# Patient Record
Sex: Female | Born: 1995
Health system: Southern US, Community
[De-identification: ages and names within clinical notes are randomized; demographics above are authoritative.]

## PROBLEM LIST (undated history)

## (undated) DIAGNOSIS — S42009A Fracture of unspecified part of unspecified clavicle, initial encounter for closed fracture: Secondary | ICD-10-CM

---
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
## 2017-04-18 ENCOUNTER — Emergency Department (HOSPITAL_BASED_OUTPATIENT_CLINIC_OR_DEPARTMENT_OTHER)
Admission: EM | Admit: 2017-04-18 | Discharge: 2017-04-18 | Disposition: A | Discharge status: Home / Self Care | Payer: 59 | Attending: Emergency Medicine | Admitting: Emergency Medicine

## 2017-04-18 ENCOUNTER — Encounter (HOSPITAL_BASED_OUTPATIENT_CLINIC_OR_DEPARTMENT_OTHER): Payer: Self-pay | Admitting: *Deleted

## 2017-04-18 DIAGNOSIS — K2900 Acute gastritis without bleeding: Secondary | ICD-10-CM | POA: Insufficient documentation

## 2017-04-18 DIAGNOSIS — R1110 Vomiting, unspecified: Secondary | ICD-10-CM | POA: Diagnosis present

## 2017-04-18 LAB — PREGNANCY, URINE: Preg Test, Ur: NEGATIVE

## 2017-04-18 LAB — CBC WITH DIFFERENTIAL/PLATELET
BASOS PCT: 0 %
Basophils Absolute: 0 10*3/uL (ref 0.0–0.1)
EOS ABS: 0.1 10*3/uL (ref 0.0–0.7)
EOS PCT: 2 %
HCT: 37.4 % (ref 36.0–46.0)
Hemoglobin: 12.9 g/dL (ref 12.0–15.0)
Lymphocytes Relative: 29 %
Lymphs Abs: 1.9 10*3/uL (ref 0.7–4.0)
MCH: 30.7 pg (ref 26.0–34.0)
MCHC: 34.5 g/dL (ref 30.0–36.0)
MCV: 89 fL (ref 78.0–100.0)
MONOS PCT: 9 %
Monocytes Absolute: 0.6 10*3/uL (ref 0.1–1.0)
Neutro Abs: 3.9 10*3/uL (ref 1.7–7.7)
Neutrophils Relative %: 60 %
Platelets: 205 10*3/uL (ref 150–400)
RBC: 4.2 MIL/uL (ref 3.87–5.11)
RDW: 12.1 % (ref 11.5–15.5)
WBC: 6.6 10*3/uL (ref 4.0–10.5)

## 2017-04-18 LAB — COMPREHENSIVE METABOLIC PANEL
ALBUMIN: 4.2 g/dL (ref 3.5–5.0)
ALT: 14 U/L (ref 14–54)
AST: 20 U/L (ref 15–41)
Alkaline Phosphatase: 52 U/L (ref 38–126)
Anion gap: 8 (ref 5–15)
BUN: 14 mg/dL (ref 6–20)
CO2: 24 mmol/L (ref 22–32)
Calcium: 9.2 mg/dL (ref 8.9–10.3)
Chloride: 108 mmol/L (ref 101–111)
Creatinine, Ser: 0.77 mg/dL (ref 0.44–1.00)
GFR calc Af Amer: >60 mL/min (ref >60)
GFR calc non Af Amer: >60 mL/min (ref >60)
GLUCOSE: 100 mg/dL — AB (ref 65–99)
POTASSIUM: 3.6 mmol/L (ref 3.5–5.1)
SODIUM: 140 mmol/L (ref 135–145)
Total Bilirubin: 0.3 mg/dL (ref 0.3–1.2)
Total Protein: 7 g/dL (ref 6.5–8.1)

## 2017-04-18 LAB — URINALYSIS, ROUTINE W REFLEX MICROSCOPIC
BILIRUBIN URINE: NEGATIVE
Glucose, UA: NEGATIVE mg/dL
Hgb urine dipstick: NEGATIVE
KETONES UR: NEGATIVE mg/dL
Leukocytes, UA: NEGATIVE
Nitrite: NEGATIVE
PROTEIN: NEGATIVE mg/dL
Specific Gravity, Urine: 1.007 (ref 1.005–1.030)
pH: 5.5 (ref 5.0–8.0)

## 2017-04-18 MED ORDER — ONDANSETRON HCL 4 MG/2ML IJ SOLN
4.0000 mg | Freq: Once | INTRAMUSCULAR | Status: AC
  Administered 2017-04-18: 4 mg via INTRAVENOUS
  Filled 2017-04-18: qty 2

## 2017-04-18 MED ORDER — SODIUM CHLORIDE 0.9 % IV BOLUS (SEPSIS)
1000.0000 mL | Freq: Once | INTRAVENOUS | Status: AC
  Administered 2017-04-18: 1000 mL via INTRAVENOUS

## 2017-04-18 MED ORDER — ONDANSETRON 4 MG PO TBDP: 4.0000 mg | ORAL_TABLET | Freq: Three times a day (TID) | ORAL | 1 refills | Status: AC | PRN

## 2017-04-18 MED FILL — ONDANSETRON ODT 4 MG TABLET: 4 | 4 days supply | Qty: 10 | Fill #0

## 2017-04-18 NOTE — Discharge Instructions (Signed)
Take Zofran as needed for nausea and vomiting. Return for development of rash fever or persistent diarrhea.

## 2017-04-18 NOTE — ED Provider Notes (Signed)
[facility]PT [facility] Provider Note   CSN: [id number] Arrival date & time: 04/18/17  0865     History   Chief Complaint Chief Complaint  Patient presents with  . Emesis    HPI Lisa Trevino is a 21 y.o. female.  Patient just recently returned from [place] She was down there on a school trip did include some mission work. Patient got in very late driving back from [place]. Patient awoke around 4 in the morning and vomited the one to 2 times. Had significant chills but did not feel like she had a fever. One loose bowel movement but no subsequent vomiting or diarrhea. No rash. No history of any tick bites. Patient was feeling fine until this illness.      Past Medical History:  Diagnosis Date  . Clavicle fracture    required splinting    Patient Active Problem List   Diagnosis Date Noted  . Acute appendicitis 11/21/2013  . 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 APPENDECTOMY  11/21/2013   lap appy  . LAPAROSCOPIC APPENDECTOMY N/A 11/21/2013   Procedure: APPENDECTOMY LAPAROSCOPIC;  Surgeon: Jerilynn Mages. Gerald Stabs, MD;  Location: [facility];  Service: Pediatrics;  Laterality: N/A;  . WISDOM TOOTH EXTRACTION  01/2012    OB History    Gravida Para Term Preterm AB Living   0             SAB TAB Ectopic Multiple Live Births                   Home Medications    Prior to Admission medications   Medication Sig Start Date End Date Taking? Authorizing Provider  ondansetron (ZOFRAN ODT) 4 MG disintegrating tablet Take 1 tablet (4 mg total) by mouth every 8 (eight) hours as needed. 04/18/17   Fredia Sorrow, MD    Family History Family History  Problem Relation Age of Onset  . Cancer Maternal Grandmother        bone marrow, deceased  . Heart attack Maternal Grandfather        deceased  . Thyroid disease Paternal Grandmother   . Heart attack Paternal Grandmother        deceased  . Cancer Paternal  Grandfather        prostate  . Cancer Father        prostate    Social History Social History  Substance Use Topics  . Smoking status: Never Smoker  . Smokeless tobacco: Never Used  . Alcohol use No     Allergies   Patient has no known allergies.   Review of Systems Review of Systems  Constitutional: Positive for chills. Negative for diaphoresis and fever.  HENT: Negative for congestion.   Eyes: Negative for redness.  Respiratory: Negative for shortness of breath.   Cardiovascular: Negative for chest pain.  Gastrointestinal: Positive for nausea and vomiting. Negative for abdominal pain.  Genitourinary: Negative for dysuria.  Musculoskeletal: Negative for myalgias.  Skin: Negative for rash.  Neurological: Negative for headaches.  Hematological: Does not bruise/bleed easily.  Psychiatric/Behavioral: Negative for confusion.     Physical Exam Updated Vital Signs BP 138/72   Pulse 72   Temp 97.6 F (36.4 C)   Resp 18   Ht 1.702 m (5\' 7" )   Wt 68 kg (150 lb)   LMP 03/30/2017   SpO2 99%   BMI 23.49 kg/m   Physical Exam  Constitutional: She is oriented  to person, place, and time. She appears well-developed and well-nourished. No distress.  HENT:  Head: Normocephalic and atraumatic.  Mucous membranes slightly dry  Eyes: Pupils are equal, round, and reactive to light. Conjunctivae and EOM are normal.  Neck: Normal range of motion. Neck supple.  Cardiovascular: Normal rate and regular rhythm.   Pulmonary/Chest: Effort normal and breath sounds normal. No respiratory distress.  Abdominal: Soft. Bowel sounds are normal. There is no tenderness.  Musculoskeletal: Normal range of motion.  Neurological: She is alert and oriented to person, place, and time. No cranial nerve deficit or sensory deficit. She exhibits normal muscle tone. Coordination normal.  Skin: Skin is warm. No rash noted.  Nursing note and vitals reviewed.    ED Treatments / Results  Labs (all labs  ordered are listed, but only abnormal results are displayed) Labs Reviewed  COMPREHENSIVE METABOLIC PANEL - Abnormal; Notable for the following:       Result Value   Glucose, Bld 100 (*)    All other components within normal limits  URINALYSIS, ROUTINE W REFLEX MICROSCOPIC  PREGNANCY, URINE  CBC WITH DIFFERENTIAL/PLATELET    EKG  EKG Interpretation None       Radiology No results found.  Procedures Procedures (including critical care time)  Medications Ordered in ED Medications  sodium chloride 0.9 % bolus 1,000 mL (1,000 mLs Intravenous New Bag/Given 04/18/17 0723)  sodium chloride 0.9 % bolus 1,000 mL (0 mLs Intravenous Stopped 04/18/17 0719)  ondansetron (ZOFRAN) injection 4 mg (4 mg Intravenous Given 04/18/17 0741)     Initial Impression / Assessment and Plan / ED Course  I have reviewed the triage vital signs and the nursing notes.  Pertinent labs & imaging results that were available during my care of the patient were reviewed by me and considered in my medical decision making (see chart for details).     The patient here feeling better. The patient had already received 1 L of normal saline. Second liter ordered. Patient currently not nauseated. Patient afebrile. No evidence of any rash. No significant diarrhea.  Labs without any significant abnormalities. No leukocytosis. Liver function test are normal. Pregnancy test negative.  We'll treat the patient was Zofran IV reevaluate her after second year of fluids. Per cautioned patient that she develops fever or persistent diarrhea to return. At this time patient nontoxic no acute distress. This does not seem to have a significant fever that would be suggestive of malaria. Also there is not a significant diarrheal illness suggestive of an Escherichia coli or other infections.    Final Clinical Impressions(s) / ED Diagnoses   Final diagnoses:  Acute gastritis without hemorrhage, unspecified gastritis type    New  Prescriptions New Prescriptions   ONDANSETRON (ZOFRAN ODT) 4 MG DISINTEGRATING TABLET    Take 1 tablet (4 mg total) by mouth every 8 (eight) hours as needed.     Fredia Sorrow, MD 04/18/17 [phone]

## 2017-04-18 NOTE — ED Triage Notes (Signed)
Pt here with complaints of nausea and vomiting that began approx 330 am. Pt returned from the [place] on Tuesday. Pt also reports "shaking" nonstop.
